# Patient Record
Sex: Male | Born: 1985 | Race: White | Hispanic: No | Marital: Single | Smoking: Current some day smoker
Health system: Southern US, Community
[De-identification: ages and names within clinical notes are randomized; demographics above are authoritative.]

## PROBLEM LIST (undated history)

## (undated) DIAGNOSIS — F419 Anxiety disorder, unspecified: Secondary | ICD-10-CM

## (undated) HISTORY — DX: Anxiety disorder, unspecified: F41.9

---

## 2009-08-30 DIAGNOSIS — F341 Dysthymic disorder: Secondary | ICD-10-CM | POA: Insufficient documentation

## 2011-05-03 ENCOUNTER — Emergency Department: Payer: Self-pay | Admitting: Unknown Physician Specialty

## 2013-12-11 ENCOUNTER — Emergency Department: Payer: Self-pay | Admitting: Emergency Medicine

## 2017-05-06 ENCOUNTER — Encounter: Payer: Self-pay | Admitting: Podiatry

## 2017-05-06 ENCOUNTER — Ambulatory Visit: Payer: 59 | Admitting: Podiatry

## 2017-05-06 VITALS — Ht 68.0 in | Wt 185.0 lb

## 2017-05-06 DIAGNOSIS — B070 Plantar wart: Secondary | ICD-10-CM

## 2017-05-06 NOTE — Progress Notes (Signed)
  Subjective:  Patient ID: Johnny Tucker, male    DOB: [birth year],  MRN: [id number]  Chief Complaint  Patient presents with  . Callouses    callous/bliter/wart? bottom right foot x a couple weeks   31 y.o. male presents with the above complaint.  Reports a lesion of the bottom of right foot for a few weeks.  Tries to freeze himself in a blister that big and then popped yesterday.  History reviewed. No pertinent past medical history. History reviewed. No pertinent surgical history. No current outpatient medications on file.  Not on File Review of Systems Objective:  There were no vitals filed for this visit. General AA&O x3. Normal mood and affect.  Vascular Dorsalis pedis and posterior tibial pulses  present 2+ bilaterally  Capillary refill normal to all digits. Pedal hair growth normal.  Neurologic Epicritic sensation grossly present.  Dermatologic No open lesions. Interspaces clear of maceration. Nails well groomed and normal in appearance. Hyperkeratotic lesion plantar right foot with surrounding blistering and slight local inflammation  Orthopedic: MMT 5/5 in dorsiflexion, plantarflexion, inversion, and eversion. Normal joint ROM without pain or crepitus.   Assessment & Plan:  Patient was evaluated and treated and all questions answered.  Wart right foot -Lesion gently debrided.  Avoid destruction today due to blistering. -Padding dispensed  Return in about 4 weeks (around 06/03/2017).

## 2017-05-25 ENCOUNTER — Ambulatory Visit: Payer: 59 | Admitting: Family Medicine

## 2017-05-25 ENCOUNTER — Encounter: Payer: Self-pay | Admitting: Family Medicine

## 2017-05-25 VITALS — BP 99/61 | HR 62 | Temp 97.4°F | Ht 68.0 in | Wt 186.8 lb

## 2017-05-25 DIAGNOSIS — Z0000 Encounter for general adult medical examination without abnormal findings: Secondary | ICD-10-CM

## 2017-05-25 DIAGNOSIS — F419 Anxiety disorder, unspecified: Secondary | ICD-10-CM | POA: Diagnosis not present

## 2017-05-25 LAB — UA/M W/RFLX CULTURE, ROUTINE
BILIRUBIN UA: NEGATIVE
GLUCOSE, UA: NEGATIVE
Ketones, UA: NEGATIVE
LEUKOCYTES UA: NEGATIVE
Nitrite, UA: NEGATIVE
PH UA: 6 (ref 5.0–7.5)
Protein, UA: NEGATIVE
RBC, UA: NEGATIVE
Specific Gravity, UA: 1.025 (ref 1.005–1.030)
Urobilinogen, Ur: 0.2 mg/dL (ref 0.2–1.0)

## 2017-05-25 MED ORDER — BUSPIRONE HCL 10 MG PO TABS: 10.0000 mg | ORAL_TABLET | Freq: Two times a day (BID) | ORAL | 0 refills | Status: DC

## 2017-05-25 NOTE — Progress Notes (Deleted)
   There were no vitals taken for this visit.   Subjective:    Patient ID: Ledell NossBrice J Rockwell, male    DOB: April 11, 1986, 32 y.o.   MRN: [id number]  HPI: Ledell NossBrice J Wisz is a 32 y.o. male  Chief Complaint  Patient presents with  . Establish Care  . Anxiety    Patient stated he has really bad anxiety. States that he was diagnosed when he was little, but he stopped taking his medication.   Pt here today to establish care. Hx of anxiety since childhood, was treated with effexor but didn't like the way it made him feel so stopped it years ago.  States he still struggles on a daily basis with his anxiety. Feels keyed up all the time. Denies dysphoric moods, SI/HI, frequent panic episodes. States currently he will use some CBD products if he's having a particularly bad day.   No other known medical problems. Does need a CPE today in order to not be penalized by his insurance company. Due for flu shot and tdap but refuses both. Not fasting for labs today.   Relevant past medical, surgical, family and social history reviewed and updated as indicated. Interim medical history since our last visit reviewed. Allergies and medications reviewed and updated.  Review of Systems  Constitutional: Negative.   HENT: Negative.   Eyes: Negative.   Respiratory: Negative.   Cardiovascular: Negative.   Gastrointestinal: Negative.   Genitourinary: Negative.   Musculoskeletal: Negative.   Skin: Negative.   Neurological: Negative.   Psychiatric/Behavioral: The patient is nervous/anxious.    Per HPI unless specifically indicated above     Objective:    There were no vitals taken for this visit.  Wt Readings from Last 3 Encounters:  05/06/17 185 lb (83.9 kg)    Physical Exam  Constitutional: He is oriented to person, place, and time. He appears well-developed and well-nourished. No distress.  HENT:  Head: Atraumatic.  Right Ear: External ear normal.  Left Ear: External ear normal.  Nose: Nose  normal.  Mouth/Throat: Oropharynx is clear and moist.  Eyes: Conjunctivae are normal. Pupils are equal, round, and reactive to light. No scleral icterus.  Neck: Normal range of motion. Neck supple.  Cardiovascular: Normal rate, regular rhythm, normal heart sounds and intact distal pulses.  No murmur heard. Pulmonary/Chest: Effort normal and breath sounds normal. No respiratory distress.  Abdominal: Soft. Bowel sounds are normal. He exhibits no distension and no mass. There is no tenderness. There is no guarding.  Musculoskeletal: Normal range of motion. He exhibits no edema or tenderness.  Neurological: He is alert and oriented to person, place, and time. He has normal reflexes.  Skin: Skin is warm and dry. No rash noted.  Psychiatric: He has a normal mood and affect. His behavior is normal.  Nursing note and vitals reviewed.  No results found for this or any previous visit.    Assessment & Plan:   Problem List Items Addressed This Visit    None       Follow up plan: No Follow-up on file.

## 2017-05-25 NOTE — Assessment & Plan Note (Signed)
Will trial buspar and monitor closely for benefit. Risks and side effects reviewed. F/u in 6 weeks for recheck

## 2017-05-25 NOTE — Patient Instructions (Signed)
Follow up in 6 weeks for anxiety f/u

## 2017-05-25 NOTE — Progress Notes (Signed)
BP 99/61 (BP Location: Right Arm, Patient Position: Sitting, Cuff Size: Normal)   Pulse 62   Temp (!) 97.4 F (36.3 C) (Oral)   Ht 5\' 8"  (1.727 m)   Wt 186 lb 12.8 oz (84.7 kg)   SpO2 97%   BMI 28.40 kg/m    Subjective:    Patient ID: Johnny Tucker Shiley, male    DOB: February 08, 1986, 32 y.o.   MRN: [id number]  HPI: Johnny Tucker Cilia is a 32 y.o. male presenting on 05/25/2017 for comprehensive medical examination. Current medical complaints include:see below  Pt here today to establish care. Hx of anxiety since childhood, was treated with effexor but didn't like the way it made him feel so stopped it years ago.  States he still struggles on a daily basis with his anxiety. Feels keyed up all the time. Denies dysphoric moods, SI/HI, frequent panic episodes. States currently he will use some CBD products if he's having a particularly bad day.   No other known medical problems. Does need a CPE today in order to not be penalized by his insurance company. Due for flu shot and tdap but refuses both. Not fasting for labs today.   Depression Screen done today and results listed below:  No flowsheet data found.  The patient does not have a history of falls. I did not complete a risk assessment for falls. A plan of care for falls was not documented.   Past Medical History:  Past Medical History:  Diagnosis Date  . Anxiety     Surgical History:  History reviewed. No pertinent surgical history.  Medications:  No current outpatient medications on file prior to visit.   No current facility-administered medications on file prior to visit.     Allergies:  No Known Allergies  Social History:  Social History   Socioeconomic History  . Marital status: Single    Spouse name: Not on file  . Number of children: Not on file  . Years of education: Not on file  . Highest education level: Not on file  Social Needs  . Financial resource strain: Not on file  . Food insecurity - worry: Not on file    . Food insecurity - inability: Not on file  . Transportation needs - medical: Not on file  . Transportation needs - non-medical: Not on file  Occupational History  . Not on file  Tobacco Use  . Smoking status: Current Some Day Smoker  . Smokeless tobacco: Never Used  Substance and Sexual Activity  . Alcohol use: No    Frequency: Never  . Drug use: No  . Sexual activity: Not on file  Other Topics Concern  . Not on file  Social History Narrative  . Not on file   Social History   Tobacco Use  Smoking Status Current Some Day Smoker  Smokeless Tobacco Never Used   Social History   Substance and Sexual Activity  Alcohol Use No  . Frequency: Never    Family History:  Family History  Problem Relation Age of Onset  . Cancer Maternal Grandmother        Lung  . Stroke Maternal Grandmother     Past medical history, surgical history, medications, allergies, family history and social history reviewed with patient today and changes made to appropriate areas of the chart.   Review of Systems - General ROS: negative Psychological ROS: negative positive for - anxiety Ophthalmic ROS: negative ENT ROS: negative Allergy and Immunology ROS: negative Respiratory ROS:  no cough, shortness of breath, or wheezing Cardiovascular ROS: no chest pain or dyspnea on exertion Gastrointestinal ROS: no abdominal pain, change in bowel habits, or black or bloody stools Genito-Urinary ROS: no dysuria, trouble voiding, or hematuria Musculoskeletal ROS: negative Neurological ROS: no TIA or stroke symptoms Dermatological ROS: negative All other ROS negative except what is listed above and in the HPI.      Objective:    BP 99/61 (BP Location: Right Arm, Patient Position: Sitting, Cuff Size: Normal)   Pulse 62   Temp (!) 97.4 F (36.3 C) (Oral)   Ht 5\' 8"  (1.727 m)   Wt 186 lb 12.8 oz (84.7 kg)   SpO2 97%   BMI 28.40 kg/m   Wt Readings from Last 3 Encounters:  05/25/17 186 lb 12.8 oz (84.7  kg)  05/06/17 185 lb (83.9 kg)    Physical Exam  Constitutional: He is oriented to person, place, and time. He appears well-developed and well-nourished. No distress.  HENT:  Head: Atraumatic.  Right Ear: External ear normal.  Left Ear: External ear normal.  Nose: Nose normal.  Mouth/Throat: Oropharynx is clear and moist.  Eyes: Conjunctivae are normal. Pupils are equal, round, and reactive to light. No scleral icterus.  Neck: Normal range of motion. Neck supple.  Cardiovascular: Normal rate, regular rhythm, normal heart sounds and intact distal pulses.  No murmur heard. Pulmonary/Chest: Effort normal and breath sounds normal. No respiratory distress.  Abdominal: Soft. Bowel sounds are normal. He exhibits no distension and no mass. There is no tenderness. There is no guarding.  Musculoskeletal: Normal range of motion. He exhibits no edema or tenderness.  Neurological: He is alert and oriented to person, place, and time. He has normal reflexes.  Skin: Skin is warm and dry. No rash noted.  Psychiatric: He has a normal mood and affect. His behavior is normal.  Nursing note and vitals reviewed.   No results found for this or any previous visit.    Assessment & Plan:   Problem List Items Addressed This Visit      Other   Anxiety - Primary    Will trial buspar and monitor closely for benefit. Risks and side effects reviewed. F/u in 6 weeks for recheck      Relevant Medications   busPIRone (BUSPAR) 10 MG tablet    Other Visit Diagnoses    Annual physical exam       Non-fasting labs drawn today. F/u in 1 year for CPE   Relevant Orders   CBC with Differential/Platelet   Comprehensive metabolic panel   Lipid Panel w/o Chol/HDL Ratio   TSH   UA/M w/rflx Culture, Routine       Discussed aspirin prophylaxis for myocardial infarction prevention and decision was it was not indicated  LABORATORY TESTING:  Health maintenance labs ordered today as discussed above.   The natural  history of prostate cancer and ongoing controversy regarding screening and potential treatment outcomes of prostate cancer has been discussed with the patient. The meaning of a false positive PSA and a false negative PSA has been discussed. He indicates understanding of the limitations of this screening test and wishes not to proceed with screening PSA testing.   IMMUNIZATIONS:   - Tdap: Tetanus vaccination status reviewed: postponed. - Influenza: Refused  PATIENT COUNSELING:    Sexuality: Discussed sexually transmitted diseases, partner selection, use of condoms, avoidance of unintended pregnancy  and contraceptive alternatives.   Advised to avoid cigarette smoking.  I discussed with the  patient that most people either abstain from alcohol or drink within safe limits (<=14/week and <=4 drinks/occasion for males, <=7/weeks and <= 3 drinks/occasion for females) and that the risk for alcohol disorders and other health effects rises proportionally with the number of drinks per week and how often a drinker exceeds daily limits.  Discussed cessation/primary prevention of drug use and availability of treatment for abuse.   Diet: Encouraged to adjust caloric intake to maintain  or achieve ideal body weight, to reduce intake of dietary saturated fat and total fat, to limit sodium intake by avoiding high sodium foods and not adding table salt, and to maintain adequate dietary potassium and calcium preferably from fresh fruits, vegetables, and low-fat dairy products.    stressed the importance of regular exercise  Injury prevention: Discussed safety belts, safety helmets, smoke detector, smoking near bedding or upholstery.   Dental health: Discussed importance of regular tooth brushing, flossing, and dental visits.   Follow up plan: NEXT PREVENTATIVE PHYSICAL DUE IN 1 YEAR. Return in about 6 weeks (around 07/06/2017) for Anxiety.

## 2017-05-26 ENCOUNTER — Encounter: Payer: Self-pay | Admitting: Family Medicine

## 2017-05-26 LAB — COMPREHENSIVE METABOLIC PANEL
ALK PHOS: 69 IU/L (ref 39–117)
ALT: 16 IU/L (ref 0–44)
AST: 14 IU/L (ref 0–40)
Albumin/Globulin Ratio: 2.1 (ref 1.2–2.2)
Albumin: 4.6 g/dL (ref 3.5–5.5)
BUN/Creatinine Ratio: 17 (ref 9–20)
BUN: 14 mg/dL (ref 6–20)
Bilirubin Total: 0.4 mg/dL (ref 0.0–1.2)
CALCIUM: 9.4 mg/dL (ref 8.7–10.2)
CO2: 23 mmol/L (ref 20–29)
CREATININE: 0.82 mg/dL (ref 0.76–1.27)
Chloride: 104 mmol/L (ref 96–106)
GFR calc Af Amer: 135 mL/min/{1.73_m2} (ref >59)
GFR, EST NON AFRICAN AMERICAN: 117 mL/min/{1.73_m2} (ref >59)
GLUCOSE: 80 mg/dL (ref 65–99)
Globulin, Total: 2.2 g/dL (ref 1.5–4.5)
Potassium: 4.2 mmol/L (ref 3.5–5.2)
SODIUM: 142 mmol/L (ref 134–144)
Total Protein: 6.8 g/dL (ref 6.0–8.5)

## 2017-05-26 LAB — CBC WITH DIFFERENTIAL/PLATELET
BASOS ABS: 0 10*3/uL (ref 0.0–0.2)
Basos: 0 %
EOS (ABSOLUTE): 0.4 10*3/uL (ref 0.0–0.4)
EOS: 4 %
Hematocrit: 45.5 % (ref 37.5–51.0)
Hemoglobin: 15.5 g/dL (ref 13.0–17.7)
IMMATURE GRANULOCYTES: 0 %
Immature Grans (Abs): 0 10*3/uL (ref 0.0–0.1)
LYMPHS ABS: 2 10*3/uL (ref 0.7–3.1)
Lymphs: 24 %
MCH: 28.9 pg (ref 26.6–33.0)
MCHC: 34.1 g/dL (ref 31.5–35.7)
MCV: 85 fL (ref 79–97)
MONOS ABS: 0.6 10*3/uL (ref 0.1–0.9)
Monocytes: 8 %
NEUTROS PCT: 64 %
Neutrophils Absolute: 5.3 10*3/uL (ref 1.4–7.0)
PLATELETS: 230 10*3/uL (ref 150–379)
RBC: 5.36 x10E6/uL (ref 4.14–5.80)
RDW: 13.7 % (ref 12.3–15.4)
WBC: 8.3 10*3/uL (ref 3.4–10.8)

## 2017-05-26 LAB — TSH: TSH: 0.764 u[IU]/mL (ref 0.450–4.500)

## 2017-05-26 LAB — LIPID PANEL W/O CHOL/HDL RATIO
Cholesterol, Total: 159 mg/dL (ref 100–199)
HDL: 34 mg/dL — AB (ref >39)
LDL CALC: 89 mg/dL (ref 0–99)
TRIGLYCERIDES: 181 mg/dL — AB (ref 0–149)
VLDL Cholesterol Cal: 36 mg/dL (ref 5–40)

## 2017-06-03 ENCOUNTER — Ambulatory Visit: Payer: 59 | Admitting: Podiatry

## 2017-07-08 ENCOUNTER — Encounter: Payer: Self-pay | Admitting: Family Medicine

## 2017-07-08 MED ORDER — BUSPIRONE HCL 10 MG PO TABS: 10.0000 mg | ORAL_TABLET | Freq: Two times a day (BID) | ORAL | 0 refills | Status: DC

## 2017-07-12 ENCOUNTER — Ambulatory Visit: Payer: 59 | Admitting: Family Medicine

## 2017-07-12 ENCOUNTER — Encounter: Payer: Self-pay | Admitting: Family Medicine

## 2017-07-12 VITALS — BP 104/57 | HR 57 | Temp 97.9°F | Wt 188.9 lb

## 2017-07-12 DIAGNOSIS — F419 Anxiety disorder, unspecified: Secondary | ICD-10-CM | POA: Diagnosis not present

## 2017-07-12 MED ORDER — BUPROPION HCL ER (XL) 150 MG PO TB24: 150.0000 mg | ORAL_TABLET | Freq: Every day | ORAL | 1 refills | Status: AC

## 2017-07-12 NOTE — Progress Notes (Signed)
BP (!) 104/57 (BP Location: Left Arm, Patient Position: Sitting, Cuff Size: Normal)   Pulse (!) 57   Temp 97.9 F (36.6 C) (Oral)   Wt 188 lb 14.4 oz (85.7 kg)   SpO2 95%   BMI 28.72 kg/m    Subjective:    Patient ID: Johnny Tucker, male    DOB: [birth year], 32 y.o.   MRN: [id number]  HPI: Johnny Tucker is a 32 y.o. male  Chief Complaint  Patient presents with  . Anxiety    Anxiety follow-up. Buspar on back order.   Pt presents today for anxiety follow up. Was started on buspar with minimal improvement. Has been off of buspar for 5 days now since the pharmacy shortage and not wanting to continue once manufacturers produce more. Still feeling "worked up" and like his moods aren't at a good level daily. Denies crying spells, significant mood swings, hallucinations, SI/HI. Does not want to try any SSRIs/SNRIs due to significant side effects when he was younger to effexor.   Relevant past medical, surgical, family and social history reviewed and updated as indicated. Interim medical history since our last visit reviewed. Allergies and medications reviewed and updated.  Review of Systems  Per HPI unless specifically indicated above     Objective:    BP (!) 104/57 (BP Location: Left Arm, Patient Position: Sitting, Cuff Size: Normal)   Pulse (!) 57   Temp 97.9 F (36.6 C) (Oral)   Wt 188 lb 14.4 oz (85.7 kg)   SpO2 95%   BMI 28.72 kg/m   Wt Readings from Last 3 Encounters:  07/12/17 188 lb 14.4 oz (85.7 kg)  05/25/17 186 lb 12.8 oz (84.7 kg)  05/06/17 185 lb (83.9 kg)    Physical Exam  Constitutional: He is oriented to person, place, and time. He appears well-developed and well-nourished. No distress.  HENT:  Head: Atraumatic.  Eyes: Conjunctivae are normal. Pupils are equal, round, and reactive to light.  Neck: Normal range of motion. Neck supple.  Cardiovascular: Normal rate and normal heart sounds.  Pulmonary/Chest: Effort normal and breath sounds normal. No  respiratory distress.  Musculoskeletal: Normal range of motion.  Neurological: He is alert and oriented to person, place, and time.  Skin: Skin is warm and dry.  Psychiatric: He has a normal mood and affect. His behavior is normal.  Nursing note and vitals reviewed.  Results for orders placed or performed in visit on 05/25/17  CBC with Differential/Platelet  Result Value Ref Range   WBC 8.3 3.4 - 10.8 x10E3/uL   RBC 5.36 4.14 - 5.80 x10E6/uL   Hemoglobin 15.5 13.0 - 17.7 g/dL   Hematocrit 40.9 81.1 - 51.0 %   MCV 85 79 - 97 fL   MCH 28.9 26.6 - 33.0 pg   MCHC 34.1 31.5 - 35.7 g/dL   RDW 91.4 78.2 - 95.6 %   Platelets 230 150 - 379 x10E3/uL   Neutrophils 64 Not Estab. %   Lymphs 24 Not Estab. %   Monocytes 8 Not Estab. %   Eos 4 Not Estab. %   Basos 0 Not Estab. %   Neutrophils Absolute 5.3 1.4 - 7.0 x10E3/uL   Lymphocytes Absolute 2.0 0.7 - 3.1 x10E3/uL   Monocytes Absolute 0.6 0.1 - 0.9 x10E3/uL   EOS (ABSOLUTE) 0.4 0.0 - 0.4 x10E3/uL   Basophils Absolute 0.0 0.0 - 0.2 x10E3/uL   Immature Granulocytes 0 Not Estab. %   Immature Grans (Abs) 0.0 0.0 - 0.1 x10E3/uL  Comprehensive metabolic panel  Result Value Ref Range   Glucose 80 65 - 99 mg/dL   BUN 14 6 - 20 mg/dL   Creatinine, Ser 1.610.82 0.76 - 1.27 mg/dL   GFR calc non Af Amer 117 >59 mL/min/1.73   GFR calc Af Amer 135 >59 mL/min/1.73   BUN/Creatinine Ratio 17 9 - 20   Sodium 142 134 - 144 mmol/L   Potassium 4.2 3.5 - 5.2 mmol/L   Chloride 104 96 - 106 mmol/L   CO2 23 20 - 29 mmol/L   Calcium 9.4 8.7 - 10.2 mg/dL   Total Protein 6.8 6.0 - 8.5 g/dL   Albumin 4.6 3.5 - 5.5 g/dL   Globulin, Total 2.2 1.5 - 4.5 g/dL   Albumin/Globulin Ratio 2.1 1.2 - 2.2   Bilirubin Total 0.4 0.0 - 1.2 mg/dL   Alkaline Phosphatase 69 39 - 117 IU/L   AST 14 0 - 40 IU/L   ALT 16 0 - 44 IU/L  Lipid Panel w/o Chol/HDL Ratio  Result Value Ref Range   Cholesterol, Total 159 100 - 199 mg/dL   Triglycerides 096181 (H) 0 - 149 mg/dL   HDL 34  (L) >04>39 mg/dL   VLDL Cholesterol Cal 36 5 - 40 mg/dL   LDL Calculated 89 0 - 99 mg/dL  TSH  Result Value Ref Range   TSH 0.764 0.450 - 4.500 uIU/mL  UA/M w/rflx Culture, Routine  Result Value Ref Range   Specific Gravity, UA 1.025 1.005 - 1.030   pH, UA 6.0 5.0 - 7.5   Color, UA Orange Yellow   Appearance Ur Clear Clear   Leukocytes, UA Negative Negative   Protein, UA Negative Negative/Trace   Glucose, UA Negative Negative   Ketones, UA Negative Negative   RBC, UA Negative Negative   Bilirubin, UA Negative Negative   Urobilinogen, Ur 0.2 0.2 - 1.0 mg/dL   Nitrite, UA Negative Negative      Assessment & Plan:   Problem List Items Addressed This Visit      Other   Anxiety - Primary    Long discussion with pt regarding some options. Pt wanting to try wellbutrin given minimal side effect panel for weight gain, sedation, sexual side effects. Reviewed potential for worsening anxiety/agitation, pt willing to see how it goes. Monitor closely for worsening moods      Relevant Medications   buPROPion (WELLBUTRIN XL) 150 MG 24 hr tablet       Follow up plan: Return in about 6 weeks (around 08/23/2017) for Anxiety.

## 2017-07-13 NOTE — Assessment & Plan Note (Signed)
Long discussion with pt regarding some options. Pt wanting to try wellbutrin given minimal side effect panel for weight gain, sedation, sexual side effects. Reviewed potential for worsening anxiety/agitation, pt willing to see how it goes. Monitor closely for worsening moods

## 2017-07-13 NOTE — Patient Instructions (Signed)
Follow-up in 6 weeks

## 2017-08-23 ENCOUNTER — Ambulatory Visit: Payer: 59 | Admitting: Family Medicine

## 2021-03-08 ENCOUNTER — Emergency Department: Payer: Self-pay

## 2021-03-08 ENCOUNTER — Other Ambulatory Visit: Payer: Self-pay

## 2021-03-08 ENCOUNTER — Encounter: Payer: Self-pay | Admitting: Emergency Medicine

## 2021-03-08 ENCOUNTER — Emergency Department
Admission: EM | Admit: 2021-03-08 | Discharge: 2021-03-08 | Disposition: A | Discharge status: Home / Self Care | Payer: Self-pay | Attending: Emergency Medicine | Admitting: Emergency Medicine

## 2021-03-08 DIAGNOSIS — M79602 Pain in left arm: Secondary | ICD-10-CM | POA: Insufficient documentation

## 2021-03-08 DIAGNOSIS — R079 Chest pain, unspecified: Secondary | ICD-10-CM | POA: Insufficient documentation

## 2021-03-08 LAB — CBC WITH DIFFERENTIAL/PLATELET
Abs Immature Granulocytes: 0.02 10*3/uL (ref 0.00–0.07)
Basophils Absolute: 0.1 10*3/uL (ref 0.0–0.1)
Basophils Relative: 1 %
Eosinophils Absolute: 0.4 10*3/uL (ref 0.0–0.5)
Eosinophils Relative: 4 %
HCT: 46.4 % (ref 39.0–52.0)
Hemoglobin: 16 g/dL (ref 13.0–17.0)
Immature Granulocytes: 0 %
Lymphocytes Relative: 26 %
Lymphs Abs: 2.5 10*3/uL (ref 0.7–4.0)
MCH: 29.2 pg (ref 26.0–34.0)
MCHC: 34.5 g/dL (ref 30.0–36.0)
MCV: 84.7 fL (ref 80.0–100.0)
Monocytes Absolute: 0.6 10*3/uL (ref 0.1–1.0)
Monocytes Relative: 6 %
Neutro Abs: 6.2 10*3/uL (ref 1.7–7.7)
Neutrophils Relative %: 63 %
Platelets: 244 10*3/uL (ref 150–400)
RBC: 5.48 MIL/uL (ref 4.22–5.81)
RDW: 12.3 % (ref 11.5–15.5)
WBC: 9.7 10*3/uL (ref 4.0–10.5)
nRBC: 0 % (ref 0.0–0.2)

## 2021-03-08 LAB — COMPREHENSIVE METABOLIC PANEL
ALT: 20 U/L (ref 0–44)
AST: 20 U/L (ref 15–41)
Albumin: 4.5 g/dL (ref 3.5–5.0)
Alkaline Phosphatase: 67 U/L (ref 38–126)
Anion gap: 9 (ref 5–15)
BUN: 8 mg/dL (ref 6–20)
CO2: 27 mmol/L (ref 22–32)
Calcium: 9.4 mg/dL (ref 8.9–10.3)
Chloride: 104 mmol/L (ref 98–111)
Creatinine, Ser: 0.75 mg/dL (ref 0.61–1.24)
GFR, Estimated: >60 mL/min (ref >60)
Glucose, Bld: 114 mg/dL — ABNORMAL HIGH (ref 70–99)
Potassium: 3.7 mmol/L (ref 3.5–5.1)
Sodium: 140 mmol/L (ref 135–145)
Total Bilirubin: 1 mg/dL (ref 0.3–1.2)
Total Protein: 7.3 g/dL (ref 6.5–8.1)

## 2021-03-08 LAB — LIPASE, BLOOD: Lipase: 118 U/L — ABNORMAL HIGH (ref 11–51)

## 2021-03-08 LAB — TROPONIN I (HIGH SENSITIVITY): Troponin I (High Sensitivity): <2 ng/L (ref <18)

## 2021-03-08 NOTE — ED Triage Notes (Signed)
Pt via POV from home. Pt c/o L-sided CP intermittently that radiates down his L arm. Pt describes pain as discomfort. Denies SOB. Denies NVD. Denies cardiac hx.  Pt is A&OX4 and NAD

## 2021-03-08 NOTE — ED Provider Notes (Signed)
[facility] Emergency Department Provider Note   ____________________________________________   Event Date/Time   First MD Initiated Contact with Patient 03/08/21 1659     (approximate)  I have reviewed the triage vital signs and the nursing notes.   HISTORY  Chief Complaint Chest Pain    HPI Johnny Tucker is a 35 y.o. male who presents for left chest and arm pain  LOCATION: Left chest and arm DURATION: 1 week prior to arrival TIMING: Intermittent and worsening since onset SEVERITY: Moderate QUALITY: Aching CONTEXT: Patient states that over the past week he has had intermittent worsening left-sided chest pain with associated left arm soreness MODIFYING FACTORS: Denies any exacerbating or relieving factors ASSOCIATED SYMPTOMS: Denies any exertional dyspnea, shortness of breath, palpitations   Per medical record review, patient has history of of anxiety as well as family history of heart disease          Past Medical History:  Diagnosis Date   Anxiety     Patient Active Problem List   Diagnosis Date Noted   Anxiety 05/25/2017   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 No pertinent surgical history.  Prior to Admission medications   Medication Sig Start Date End Date Taking? Authorizing Provider  buPROPion (WELLBUTRIN XL) 150 MG 24 hr tablet Take 1 tablet (150 mg total) by mouth daily. 07/12/17   Particia Nearing, PA-C    Allergies Patient has no known allergies.  Family History  Problem Relation Age of Onset   Cancer Maternal Grandmother        Lung   Stroke Maternal Grandmother     Social History Social History   Tobacco Use   Smoking status: Some Days   Smokeless tobacco: Never  Vaping Use   Vaping Use: Every day  Substance Use Topics   Alcohol use: No   Drug use: No    Review of Systems Constitutional: No fever/chills Eyes: No visual changes. ENT: No sore throat. Cardiovascular: Endorses  chest pain. Respiratory: Denies shortness of breath. Gastrointestinal: No abdominal pain.  No nausea, no vomiting.  No diarrhea. Genitourinary: Negative for dysuria. Musculoskeletal: Positive for acute left arm soreness Skin: Negative for rash. Neurological: Negative for headaches, weakness/numbness/paresthesias in any extremity Psychiatric: Negative for suicidal ideation/homicidal ideation   ____________________________________________   PHYSICAL EXAM:  VITAL SIGNS: ED Triage Vitals  Enc Vitals Group     BP 03/08/21 1537 121/82     Pulse Rate 03/08/21 1537 64     Resp 03/08/21 1537 20     Temp 03/08/21 1537 97.7 F (36.5 C)     Temp Source 03/08/21 1537 Oral     SpO2 03/08/21 1537 95 %     Weight 03/08/21 1538 180 lb (81.6 kg)     Height 03/08/21 1538 5\' 8"  (1.727 m)     Head Circumference --      Peak Flow --      Pain Score 03/08/21 1538 6     Pain Loc --      Pain Edu? --      Excl. in GC? --    Constitutional: Alert and oriented. Well appearing and in no acute distress. Eyes: Conjunctivae are normal. PERRL. Head: Atraumatic. Nose: No congestion/rhinnorhea. Mouth/Throat: Mucous membranes are moist. Neck: No stridor Cardiovascular: Grossly normal heart sounds.  Good peripheral circulation. Respiratory: Normal respiratory effort.  No retractions. Gastrointestinal: Soft and nontender. No distention. Musculoskeletal: No obvious deformities Neurologic:  Normal speech and language. No gross  focal neurologic deficits are appreciated. Skin:  Skin is warm and dry. No rash noted. Psychiatric: Mood and affect are normal. Speech and behavior are normal.  ____________________________________________   LABS (all labs ordered are listed, but only abnormal results are displayed)  Labs Reviewed  COMPREHENSIVE METABOLIC PANEL - Abnormal; Notable for the following components:      Result Value   Glucose, Bld 114 (*)    All other components within normal limits  LIPASE,  BLOOD - Abnormal; Notable for the following components:   Lipase 118 (*)    All other components within normal limits  CBC WITH DIFFERENTIAL/PLATELET  TROPONIN I (HIGH SENSITIVITY)  TROPONIN I (HIGH SENSITIVITY)   ____________________________________________  EKG  ED ECG REPORT I, Merwyn Katos, the attending physician, personally viewed and interpreted this ECG.  Date: 03/08/2021 EKG Time: 1540 Rate: 56 Rhythm: Bradycardic sinus rhythm QRS Axis: normal Intervals: normal ST/T Wave abnormalities: normal Narrative Interpretation: Bradycardic sinus rhythm.  No evidence of acute ischemia  ____________________________________________  RADIOLOGY  ED MD interpretation: 2 view chest x-ray shows no evidence of acute abnormalities including no pneumonia, pneumothorax, or widened mediastinum  Official radiology report(s): DG Chest 2 View  Result Date: 03/08/2021 CLINICAL DATA:  Chest pain. Inline chest soreness radiates to the LEFT UPPER extremity for 2 days. History of smoking. EXAM: CHEST - 2 VIEW COMPARISON:  None. FINDINGS: Heart size is UPPER limits normal. The lungs are free of focal consolidations pleural effusions. No pulmonary edema. Visualized osseous structures have a normal appearance. IMPRESSION: No active cardiopulmonary disease. Electronically Signed   By: Norva Pavlov M.D.   On: 03/08/2021 16:00    ____________________________________________   PROCEDURES  Procedure(s) performed (including Critical Care):  .1-3 Lead EKG Interpretation Performed by: Merwyn Katos, MD Authorized by: Merwyn Katos, MD     Interpretation: normal     ECG rate:  65   ECG rate assessment: normal     Rhythm: sinus rhythm     Ectopy: none     Conduction: normal     ____________________________________________   INITIAL IMPRESSION / ASSESSMENT AND PLAN / ED COURSE  As part of my medical decision making, I reviewed the following data within the electronic medical record,  if available:  Nursing notes reviewed and incorporated, Labs reviewed, EKG interpreted, Old chart reviewed, Radiograph reviewed and Notes from prior ED visits reviewed and incorporated     Patient is a 35 year old male with the above-stated past medical history who presents for left-sided chest and arm pain that has been intermittent over the past week   Workup: ECG, CXR, CBC, BMP, Troponin Findings: ECG: No overt evidence of STEMI. No evidence of Brugadas sign, delta wave, epsilon wave, significantly prolonged QTc, or malignant arrhythmia HS Troponin: Negative x1 Other Labs unremarkable for emergent problems. CXR: Without PTX, PNA, or widened mediastinum Last Stress Test: Never Last Heart Catheterization: Never HEART Score: 3  Given History, Exam, and Workup I have low suspicion for ACS, Pneumothorax, Pneumonia, Pulmonary Embolus, Tamponade, Aortic Dissection or other emergent problem as a cause for this presentation.   Reassesment: Prior to discharge patients pain was controlled and they were well appearing.  Disposition:  Discharge. Strict return precautions discussed with patient with full understanding. Advised patient to follow up promptly with primary care provider      ____________________________________________   FINAL CLINICAL IMPRESSION(S) / ED DIAGNOSES  Final diagnoses:  Chest pain, unspecified type  Left arm pain     ED Discharge Orders  None        Note:  This document was prepared using Dragon voice recognition software and may include unintentional dictation errors.    Merwyn Katos, MD 03/08/21 [phone]

## 2021-03-08 NOTE — ED Notes (Addendum)
Pt dc ppw provided and follow up care provided. PT assisted off unit with wife on foot. No questions at this time. Verbal consent for dc given

## 2021-03-08 NOTE — ED Provider Notes (Signed)
Emergency Medicine Provider Triage Evaluation Note  Johnny Tucker , a 35 y.o. male  was evaluated in triage.  Pt complains of midline chest soreness that radiates to the left upper extremity.  Symptoms have occurred for the past 2 to 3 days.  Patient states that when he wakes up, he is asymptomatic but pain develops during the day.  He denies chest tightness and shortness of breath.  He is a daily smoker.  No nausea, vomiting or abdominal pain.  No pain in the upper back.  He denies a history of cardiac issues and does not take blood pressure medication.  Review of Systems  Positive: Patient has chest pain.  Negative: No chest tightness, shortness of breath or abdominal pain.  Physical Exam  BP 121/82 (BP Location: Left Arm)   Pulse 64   Temp 97.7 F (36.5 C) (Oral)   Resp 20   Ht 5\' 8"  (1.727 m)   Wt 81.6 kg   SpO2 95%   BMI 27.37 kg/m  Gen:   Awake, no distress   Resp:  Normal effort  MSK:   Moves extremities without difficulty  Other:    Medical Decision Making  Medically screening exam initiated at 3:39 PM.  Appropriate orders placed.  ARLYN BUERKLE was informed that the remainder of the evaluation will be completed by another provider, this initial triage assessment does not replace that evaluation, and the importance of remaining in the ED until their evaluation is complete.     Ledell Noss Ciales, PA-C 03/08/21 1540    03/10/21, MD 03/09/21 [phone]

## 2021-03-08 NOTE — ED Notes (Signed)
PT brought to room 44 and endorsing chest pain for the last few days that hasn't gotten worse but has not improved. Pt endorsing it is possible to have pulled a muscle with their work. Pt resting peacefully in room.

## 2021-11-26 IMAGING — CR DG CHEST 2V
2 series · 2 of 2 positions shown · non-contrast
Comparison: None.

CLINICAL DATA: Chest pain. Inline chest soreness radiates to the
LEFT UPPER extremity for 2 days. History of smoking.

EXAM:
CHEST - 2 VIEW

[chest pa]
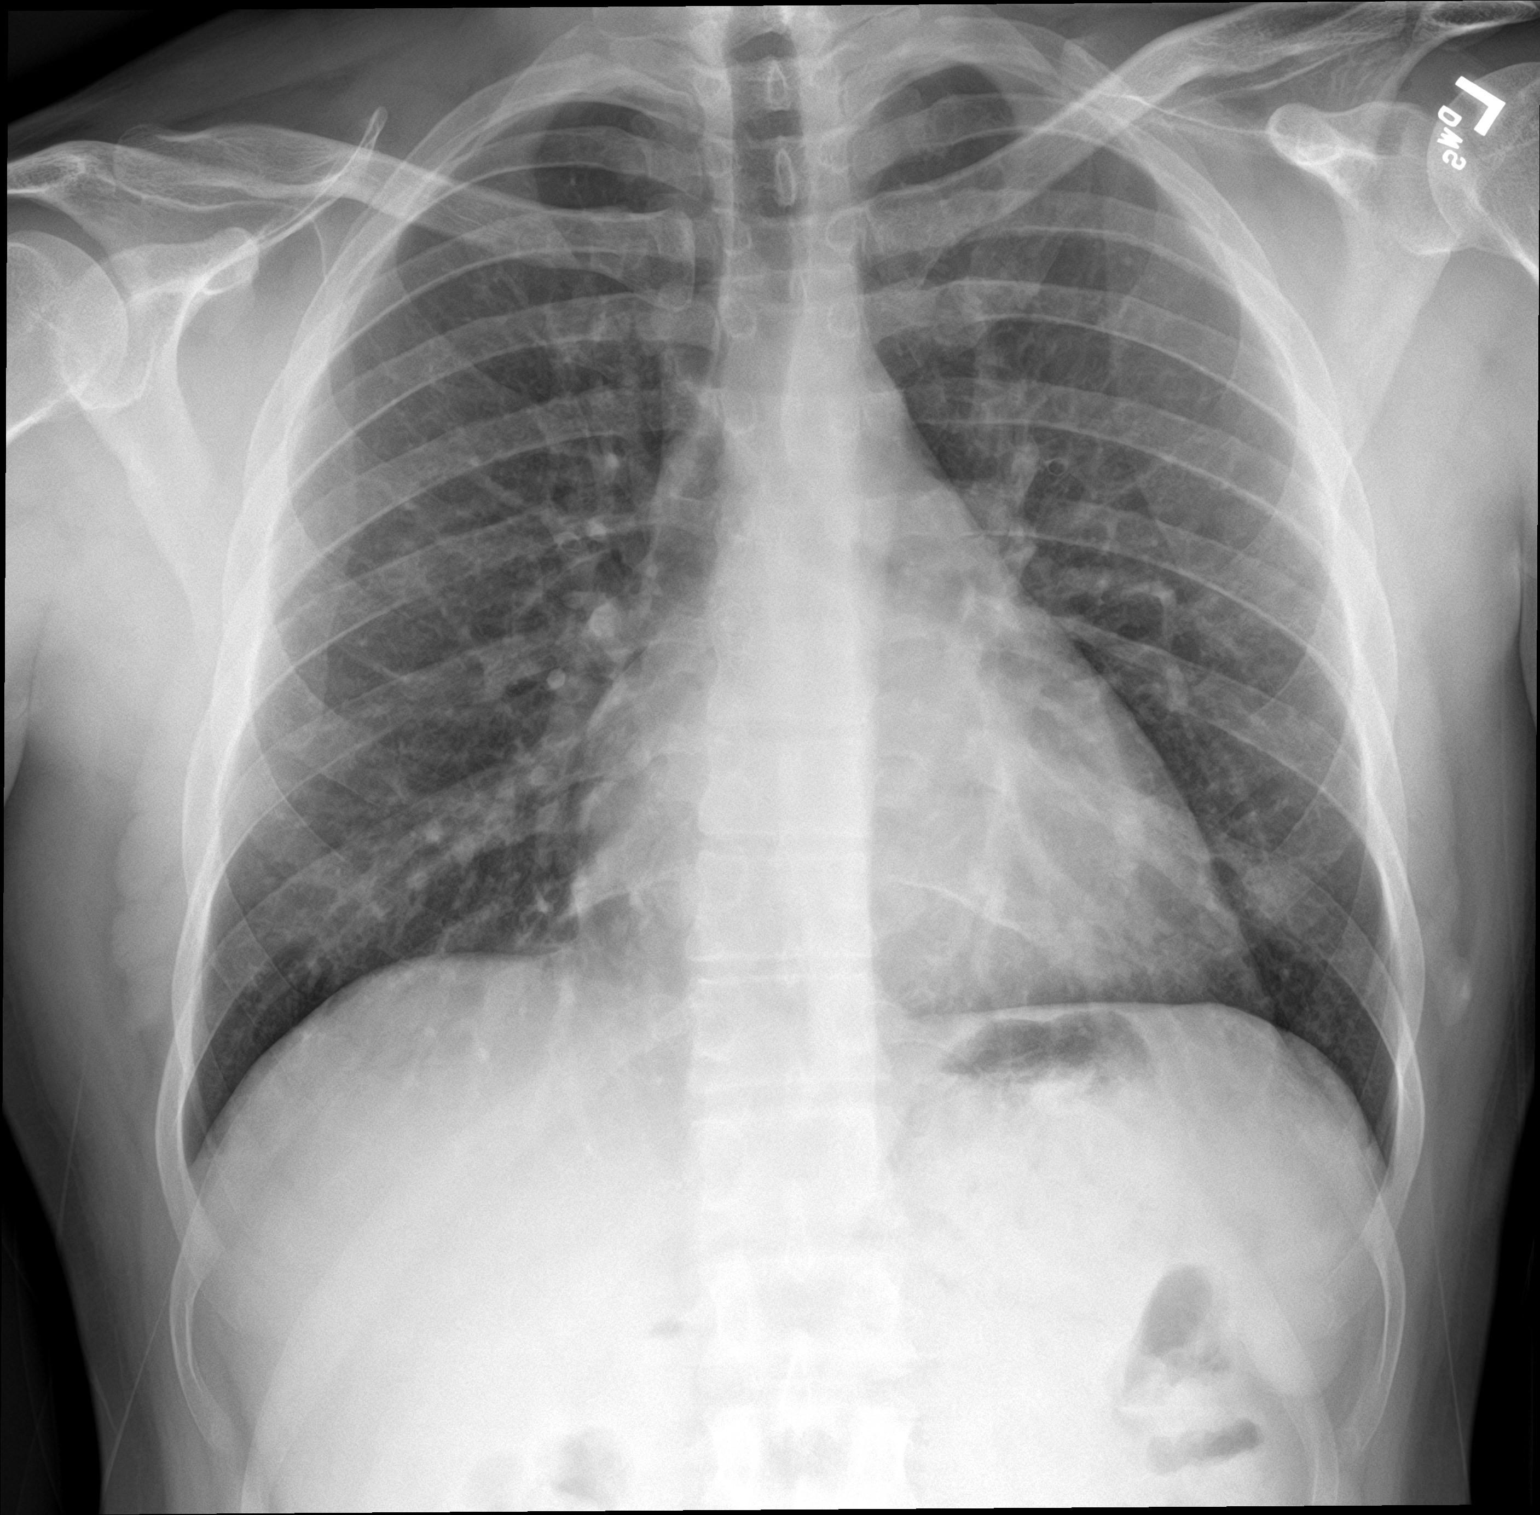

[chest lat]
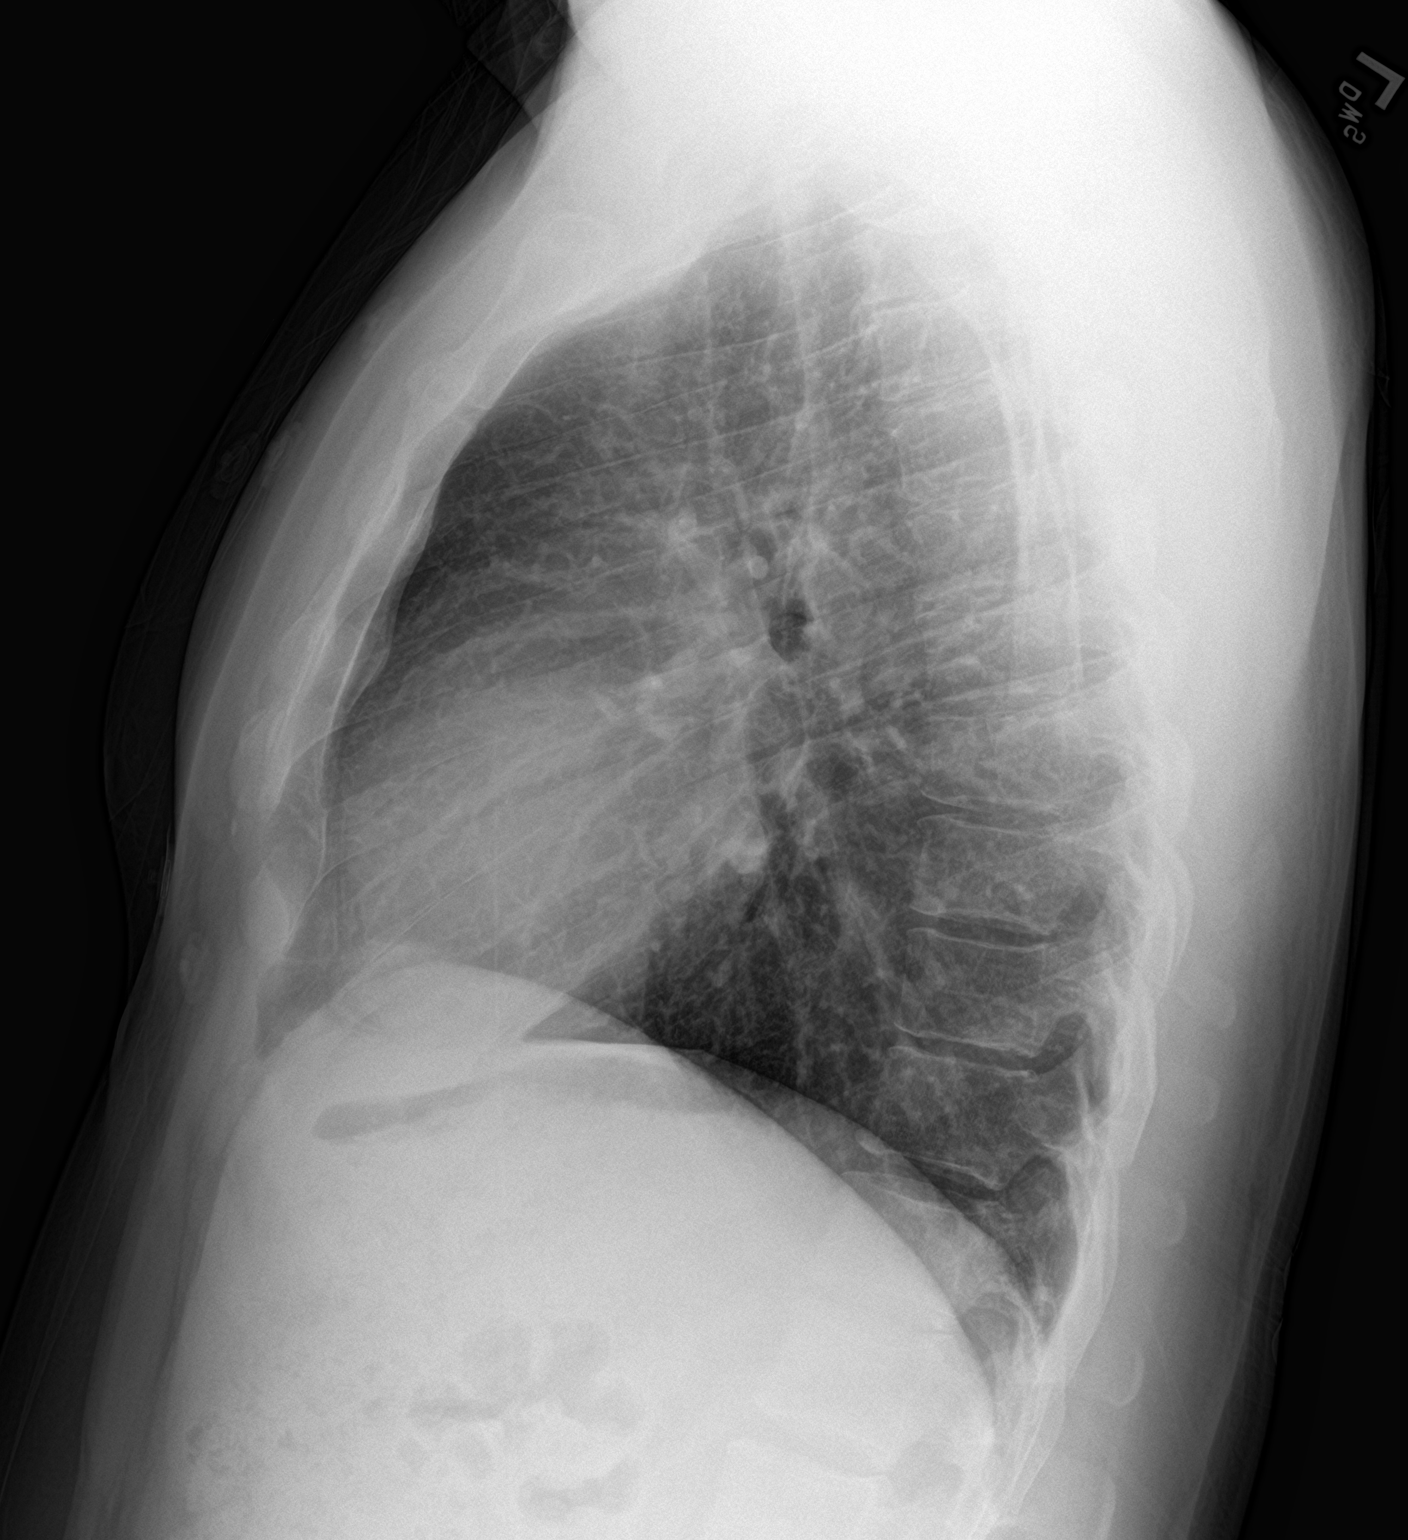

[2 of 2 positions shown; findings below may reference images not displayed]

FINDINGS: Heart size is UPPER limits normal. The lungs are free of focal
consolidations pleural effusions. No pulmonary edema. Visualized
osseous structures have a normal appearance.
IMPRESSION: No active cardiopulmonary disease.

## 2023-03-15 ENCOUNTER — Encounter: Payer: Self-pay | Admitting: Emergency Medicine
